# Patient Record
Sex: Female | Born: 1965 | Race: Black or African American | Hispanic: No | Marital: Married | Smoking: Never smoker
Health system: Southern US, Community
[De-identification: ages and names within clinical notes are randomized; demographics above are authoritative.]

## PROBLEM LIST (undated history)

## (undated) DIAGNOSIS — E079 Disorder of thyroid, unspecified: Secondary | ICD-10-CM

## (undated) DIAGNOSIS — I10 Essential (primary) hypertension: Secondary | ICD-10-CM

## (undated) HISTORY — PX: ABDOMINAL HYSTERECTOMY: SHX81

---

## 2011-01-08 ENCOUNTER — Ambulatory Visit: Payer: Self-pay | Admitting: Family Medicine

## 2011-04-12 ENCOUNTER — Ambulatory Visit: Payer: Self-pay | Admitting: Unknown Physician Specialty

## 2011-04-16 LAB — PATHOLOGY REPORT

## 2012-01-09 ENCOUNTER — Ambulatory Visit: Payer: Self-pay | Admitting: Family Medicine

## 2014-10-04 ENCOUNTER — Other Ambulatory Visit: Payer: Self-pay | Admitting: Family Medicine

## 2014-10-04 DIAGNOSIS — Z1231 Encounter for screening mammogram for malignant neoplasm of breast: Secondary | ICD-10-CM

## 2014-10-25 ENCOUNTER — Ambulatory Visit: Admission: RE | Admit: 2014-10-25 | Payer: Self-pay | Source: Ambulatory Visit

## 2015-02-04 ENCOUNTER — Emergency Department
Admission: EM | Admit: 2015-02-04 | Discharge: 2015-02-04 | Disposition: A | Discharge status: Home / Self Care | Attending: Emergency Medicine | Admitting: Emergency Medicine

## 2015-02-04 ENCOUNTER — Encounter: Payer: Self-pay | Admitting: Emergency Medicine

## 2015-02-04 DIAGNOSIS — Z792 Long term (current) use of antibiotics: Secondary | ICD-10-CM | POA: Insufficient documentation

## 2015-02-04 DIAGNOSIS — Z8639 Personal history of other endocrine, nutritional and metabolic disease: Secondary | ICD-10-CM | POA: Diagnosis not present

## 2015-02-04 DIAGNOSIS — Z79899 Other long term (current) drug therapy: Secondary | ICD-10-CM | POA: Diagnosis not present

## 2015-02-04 DIAGNOSIS — I10 Essential (primary) hypertension: Secondary | ICD-10-CM | POA: Diagnosis not present

## 2015-02-04 DIAGNOSIS — E0500 Thyrotoxicosis with diffuse goiter without thyrotoxic crisis or storm: Secondary | ICD-10-CM | POA: Diagnosis not present

## 2015-02-04 DIAGNOSIS — H16201 Unspecified keratoconjunctivitis, right eye: Secondary | ICD-10-CM | POA: Insufficient documentation

## 2015-02-04 DIAGNOSIS — H5711 Ocular pain, right eye: Secondary | ICD-10-CM | POA: Diagnosis present

## 2015-02-04 DIAGNOSIS — H10421 Simple chronic conjunctivitis, right eye: Secondary | ICD-10-CM

## 2015-02-04 HISTORY — DX: Disorder of thyroid, unspecified: E07.9

## 2015-02-04 HISTORY — DX: Essential (primary) hypertension: I10

## 2015-02-04 MED ORDER — FLUORESCEIN SODIUM 1 MG OP STRP
ORAL_STRIP | OPHTHALMIC | Status: AC
  Administered 2015-02-04: 11:00:00
  Filled 2015-02-04: qty 1

## 2015-02-04 NOTE — ED Notes (Signed)
Reports right eye redness and swelling to lid

## 2015-02-04 NOTE — Discharge Instructions (Signed)
Exophthalmos  Exophthalmos (also called proptosis) is a condition where the eyes move forward. They look as if they are "popping out." This can happen in one or both eyes. When the eyes are pushed forward, damage can be done to:  The main nerve between the eye and the brain that contains the nerves for vision (optic nerve).  The muscles that make the eye move.  The inside of the eye from increased pressure (glaucoma).  The front surface of the eye (cornea) because of exposure and dryness. CAUSES   Thyroid disease.  Overactive thyroid gland.  Certain diseases where the body reacts to its own tissues (autoimmune diseases).  Graves disease (a form of overactive thyroid disease).  Wegener's disease.  Others.  Anything pushing the eyes forward from behind.  Tumor(s).  Eye cancer.  Bleeding behind the eye from a tumor or blood vessel problems.  Trauma (with bleeding behind the eye).  Problems with the arteries and veins behind the eye (aneurysm, cavernous sinus thrombosis, etc).  Cysts or a pus filled area (abscess) behind the eye.  Tumors that have spread to the eye socket from cancer in other areas of the body (metastatic cancer).  Cancer of the blood system (lymphoma and others).  Infection behind the eye.  An abnormal skull structure.  Some genetic diseases and abnormalities.  Pseudoproptosis (or false proptosis). This is a condition where the eye looks like it is pushed forward but is really not. The eye is just bigger (longer) than normal, or the opposite eye is smaller than normal, which makes one eye look bigger.  Prominent eyes in otherwise normal people. SYMPTOMS   Bulging eye(s).  Dry and irritated eyes.  Eyes not closing all the way when asleep.  Double vision - seeing two of everything (diplopia).  Trouble looking up with one or both eyes.  Symptoms of the disease causing exophthalmos. For instance, with an overactive thyroid gland, you may feel  hot all of the time, sweat a lot, have weight loss and a lot of energy. DIAGNOSIS  An eye professional can tell you if you have this condition during an eye exam. He or she can measure how far the eye(s) are forward compared to normal. X-rays, CT scan, ultrasound and other tests may be done to look at the area behind the eyes. TREATMENT   Treatment is aimed at the condition causing exophthalmos.  If mild double vision is present, it may be possible to relieve the symptoms with special glasses containing prisms.  If severe double vision is present, or if there is danger to the eyes, surgery may be needed. Surgery can relieve the pressure on the eyes. It can also free up the eye muscles so the eyes can move properly. SEEK MEDICAL CARE IF:   It looks like one or both eyes bulging forward.  You have double vision.  You have trouble looking up.  You generally do not feel well. Document Released: 04/20/2004 Document Revised: 08/12/2011 Document Reviewed: 09/16/2007 [facility] Patient Information 2015 [place], [place]. This information is not intended to replace advice given to you by your health care provider. Make sure you discuss any questions you have with your health care provider.  Your exam did not reveal an injury to your eye or cornea. You should restart your eye drops as soon as possible. Apply cool compresses to reduce eyelid swelling, and wear sunglasses to reduce sun sensitivity. Follow-up with your ophthalmologists as soon as possible for further care. Return as needed for  worsening symptoms.

## 2015-02-04 NOTE — ED Provider Notes (Signed)
[facility] Emergency Department Provider Note ____________________________________________  Time seen: 1007  I have reviewed the triage vital signs and the nursing notes.  HISTORY  Chief Complaint  Eye Pain  HPI Jeanette Leon is a 49 y.o. female reports to the ED for evaluation and management of her right eye light sensitivity for the last day. She also reports that her right eye was increasingly swollen and red. She was seen by her ophthalmologist about 2 weeks prior, and prescribed Restasis and a Pred Forte drops. She does admit tonot dosing as to medications over the last 2 days. Which she describes a sudden onset of increasing irritation, light sensitivity to the right eye without outright pain. She describes onset was last night while she was asleep. She claims to have the sensation that her right eye had "popped out," due to her thyroid disease. She had the sensation of fullness and swelling above baseline to the right eye. He denies any eye trauma or eye injury, foreign body sensation, or visual changes. She does note some increased tearing to the eye.  Past Medical History  Diagnosis Date  . Thyroid disease   . Hypertension    There are no active problems to display for this patient.  History reviewed. No pertinent past surgical history.  Current Outpatient Rx  Name  Route  Sig  Dispense  Refill  . atorvastatin (LIPITOR) 20 MG tablet   Oral   Take 20 mg by mouth daily.         . cycloSPORINE (RESTASIS) 0.05 % ophthalmic emulsion   Both Eyes   Place 1 drop into both eyes 2 (two) times daily.         . hydrochlorothiazide (HYDRODIURIL) 25 MG tablet   Oral   Take 25 mg by mouth daily.         Marland Kitchen levothyroxine (SYNTHROID, LEVOTHROID) 112 MCG tablet   Oral   Take 112 mcg by mouth daily before breakfast.         . liothyronine (CYTOMEL) 5 MCG tablet   Oral   Take 5 mcg by mouth daily.         . prednisoLONE acetate (PRED FORTE) 1 %  ophthalmic suspension   Both Eyes   Place 1 drop into both eyes 4 (four) times daily.          Allergies Review of patient's allergies indicates no known allergies.  History reviewed. No pertinent family history.  Social History Social History  Substance Use Topics  . Smoking status: Never Smoker   . Smokeless tobacco: None  . Alcohol Use: Yes   Review of Systems  Constitutional: Negative for fever. Eyes: Negative for visual changes. Right eye redness and light sensitivity as above. ENT: Negative for sore throat. Cardiovascular: Negative for chest pain. Respiratory: Negative for shortness of breath. Gastrointestinal: Negative for abdominal pain, vomiting and diarrhea. Genitourinary: Negative for dysuria. Musculoskeletal: Negative for back pain. Skin: Negative for rash. Neurological: Negative for headaches, focal weakness or numbness. ____________________________________________  PHYSICAL EXAM:  VITAL SIGNS: ED Triage Vitals  Enc Vitals Group     BP 02/04/15 0945 124/75 mmHg     Pulse Rate 02/04/15 0945 69     Resp 02/04/15 0945 18     Temp 02/04/15 0945 98.3 F (36.8 C)     Temp Source 02/04/15 0945 Oral     SpO2 02/04/15 0945 99 %     Weight 02/04/15 0945 170 lb (77.111 kg)  Height 02/04/15 0945 5\' 6"  (1.676 m)     Head Cir --      Peak Flow --      Pain Score 02/04/15 0952 1     Pain Loc --      Pain Edu? --      Excl. in GC? --    Constitutional: Alert and oriented. Well appearing and in no distress. Eyes: Right eye with mild exophthalmos compared to the left. Mild local periorbital edema noted.  Conjunctivae are injected bilaterally. Mild conjunctival edema on the right. PERRL. Normal extraocular movements. No fluorescein dye uptake on exam.  ENT   Head: Normocephalic and atraumatic.   Nose: No congestion/rhinnorhea.   Mouth/Throat: Mucous membranes are moist.   Neck: Supple. No thyromegaly. Hematological/Lymphatic/Immunilogical: No  cervical lymphadenopathy. Cardiovascular: Normal rate, regular rhythm.  Respiratory: Normal respiratory effort. No wheezes/rales/rhonchi. Gastrointestinal: Soft and nontender. No distention. Musculoskeletal: Nontender with normal range of motion in all extremities.  Neurologic:  Normal gait without ataxia. Normal speech and language. No gross focal neurologic deficits are appreciated. Skin:  Skin is warm, dry and intact. No rash noted. Psychiatric: Mood and affect are normal. Patient exhibits appropriate insight and judgment. ____________________________________________  INITIAL IMPRESSION / ASSESSMENT AND PLAN / ED COURSE  Reassurance to the patient about her benign exam. Symptoms are likely due to her chronic proptosis that may have been aggravated by her lack of use of her rewetting drops. No indication of corneal injury or ulcer on exam. Suggest patient restart her eyedrops as previously prescribed. Follow-up with Dr. Dion Body or return to the ED for worsening symptoms. 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

## 2016-04-11 ENCOUNTER — Other Ambulatory Visit: Payer: Self-pay | Admitting: Family Medicine

## 2016-04-11 DIAGNOSIS — Z1239 Encounter for other screening for malignant neoplasm of breast: Secondary | ICD-10-CM

## 2016-05-29 ENCOUNTER — Ambulatory Visit
Admission: RE | Admit: 2016-05-29 | Discharge: 2016-05-29 | Disposition: A | Discharge status: Home / Self Care | Source: Ambulatory Visit | Attending: Family Medicine | Admitting: Family Medicine

## 2016-05-29 DIAGNOSIS — Z1231 Encounter for screening mammogram for malignant neoplasm of breast: Secondary | ICD-10-CM | POA: Diagnosis not present

## 2016-05-29 DIAGNOSIS — Z1239 Encounter for other screening for malignant neoplasm of breast: Secondary | ICD-10-CM

## 2016-11-04 ENCOUNTER — Emergency Department
Admission: EM | Admit: 2016-11-04 | Discharge: 2016-11-04 | Disposition: A | Discharge status: Home / Self Care | Attending: Emergency Medicine | Admitting: Emergency Medicine

## 2016-11-04 ENCOUNTER — Encounter: Payer: Self-pay | Admitting: Emergency Medicine

## 2016-11-04 DIAGNOSIS — M5117 Intervertebral disc disorders with radiculopathy, lumbosacral region: Secondary | ICD-10-CM | POA: Insufficient documentation

## 2016-11-04 DIAGNOSIS — I10 Essential (primary) hypertension: Secondary | ICD-10-CM | POA: Insufficient documentation

## 2016-11-04 DIAGNOSIS — M545 Low back pain: Secondary | ICD-10-CM | POA: Diagnosis present

## 2016-11-04 DIAGNOSIS — Z79899 Other long term (current) drug therapy: Secondary | ICD-10-CM | POA: Insufficient documentation

## 2016-11-04 DIAGNOSIS — M5431 Sciatica, right side: Secondary | ICD-10-CM

## 2016-11-04 DIAGNOSIS — M5137 Other intervertebral disc degeneration, lumbosacral region: Secondary | ICD-10-CM

## 2016-11-04 MED ORDER — KETOROLAC TROMETHAMINE 30 MG/ML IJ SOLN
60.0000 mg | Freq: Once | INTRAMUSCULAR | Status: AC
  Administered 2016-11-04: 30 mg via INTRAMUSCULAR
  Filled 2016-11-04: qty 2

## 2016-11-04 MED ORDER — ORPHENADRINE CITRATE 30 MG/ML IJ SOLN
60.0000 mg | INTRAMUSCULAR | Status: AC
  Administered 2016-11-04: 60 mg via INTRAMUSCULAR
  Filled 2016-11-04: qty 2

## 2016-11-04 MED ORDER — DIAZEPAM 2 MG PO TABS: 2.0000 mg | ORAL_TABLET | Freq: Three times a day (TID) | ORAL | 0 refills | Status: AC | PRN

## 2016-11-04 MED ORDER — PREDNISONE 10 MG PO TABS: 10.0000 mg | ORAL_TABLET | Freq: Two times a day (BID) | ORAL | 0 refills | Status: DC

## 2016-11-04 NOTE — Discharge Instructions (Signed)
You are being treated for a sciatic nerve irritation. Take the prescription meds as directed. Apply ice or moist heat to the area for comfort. Follow-up with Dr. Terance HartBronstein for ongoing management. Monitor your body mechanics and return to the ED for worsening pain, leg weakness, or loss of bladder or bowel control.

## 2016-11-04 NOTE — ED Notes (Signed)
See triage note  Developed right lower back pain which moves into right leg about 2 weeks ago..was seen by PCP and placed on mobic and muscle relaxers  States pain is not any better.   Pain is worse with sitting or standing

## 2016-11-04 NOTE — ED Triage Notes (Signed)
Pt c/o lower back pain X 2 weeks that is radiating down right leg. [facility]  Pt appears in pain. Worse with movement. No loss bowel or bladder.

## 2016-11-05 NOTE — ED Provider Notes (Signed)
[facility] Emergency Department Provider Note ____________________________________________  Time seen: 1032  I have reviewed the triage vital signs and the nursing notes.  HISTORY  Chief Complaint  Back Pain  HPI Jeanette Leon is a 51 y.o. female [facility] for evaluation of continued low back pain. Patient describes onset of low back pain about 2 weeks prior. She reports radiation of pain in the posterior right leg to the ankle. She was evaluated by her primary care provider, Dr. Terance HartBronstein, who scheduled her for a x-ray. She was discharged with prescriptions for Flexeril and Mobic. She returns today after her pain worsened. She attempted to follow with Dr. Terance HartBronstein in the office, but he was unavailable. [facility] and was referred to the ED for further management. She was able to vision on her My Chart, to confirm a diagnosis of DDD of the lumbar spine.  Past Medical History:  Diagnosis Date  . Hypertension   . Thyroid disease     There are no active problems to display for this patient.   History reviewed. No pertinent surgical history.  Prior to Admission medications   Medication Sig Start Date End Date Taking? Authorizing Provider  cyclobenzaprine (FLEXERIL) 10 MG tablet Take 10 mg by mouth 3 (three) times daily as needed for muscle spasms.   Yes [provider]  meloxicam (MOBIC) 15 MG tablet Take 15 mg by mouth daily.   Yes [provider]  atorvastatin (LIPITOR) 20 MG tablet Take 20 mg by mouth daily.    [provider]  cycloSPORINE (RESTASIS) 0.05 % ophthalmic emulsion Place 1 drop into both eyes 2 (two) times daily.    [provider]  diazepam (VALIUM) 2 MG tablet Take 1 tablet (2 mg total) by mouth every 8 (eight) hours as needed for muscle spasms. 11/04/16   Milarose Savich, Charlesetta IvoryJenise V Bacon, PA-C  hydrochlorothiazide (HYDRODIURIL) 25 MG tablet Take 25 mg by mouth daily.     [provider]  levothyroxine (SYNTHROID, LEVOTHROID) 112 MCG tablet Take 112 mcg by mouth daily before breakfast.    [provider]  liothyronine (CYTOMEL) 5 MCG tablet Take 5 mcg by mouth daily.    [provider]  prednisoLONE acetate (PRED FORTE) 1 % ophthalmic suspension Place 1 drop into both eyes 4 (four) times daily.    [provider]  predniSONE (DELTASONE) 10 MG tablet Take 1 tablet (10 mg total) by mouth 2 (two) times daily with a meal. 11/04/16   Kyjuan Gause, Charlesetta IvoryJenise V Bacon, PA-C    Allergies Patient has no known allergies.  Family History  Problem Relation Age of Onset  . Breast cancer Maternal Aunt        60's  . Breast cancer Paternal Aunt 1055    Social History Social History  Substance Use Topics  . Smoking status: Never Smoker  . Smokeless tobacco: Not on file  . Alcohol use Yes    Review of Systems  Constitutional: Negative for fever. Cardiovascular: Negative for chest pain. Respiratory: Negative for shortness of breath. Gastrointestinal: Negative for abdominal pain, vomiting and diarrhea. Genitourinary: Negative for dysuria. Musculoskeletal: Positive for back pain. Skin: Negative for rash. Neurological: Negative for headaches, focal weakness or numbness. ____________________________________________  PHYSICAL EXAM:  VITAL SIGNS: ED Triage Vitals  Enc Vitals Group     BP 11/04/16 0951 (!) 139/110     Pulse Rate 11/04/16 0951 70     Resp 11/04/16 0951 20  Temp 11/04/16 0951 97.5 F (36.4 C)     Temp Source 11/04/16 0951 Oral     SpO2 11/04/16 0951 100 %     Weight --      Height --      Head Circumference --      Peak Flow --      Pain Score 11/04/16 0948 10     Pain Loc --      Pain Edu? --      Excl. in GC? --     Constitutional: Alert and oriented. Well appearing and in no distress. Head: Normocephalic and atraumatic. Cardiovascular: Normal rate, regular rhythm. Normal distal pulses. Respiratory:  Normal respiratory effort. No wheezes/rales/rhonchi. Gastrointestinal: Soft and nontender. No distention. Musculoskeletal: Normal spinal alignment without midline tenderness, spasm, deformity, or step-off. Patient is to palpation over the right gluteal region with referral down the posterior right lower extremity. She is to demonstrate normal toe and heel walking. Decreased lumbar extension and flexion range secondary to pain. Nontender with normal range of motion in all extremities.  Neurologic: Cranial nerves II through XII grossly intact. Normal LE DTRs bilaterally. Antalgic cane-assisted gait without ataxia. Normal speech and language. No gross focal neurologic deficits are appreciated. Skin:  Skin is warm, dry and intact. No rash noted. Psychiatric: Mood and affect are normal. Patient exhibits appropriate insight and judgment. ___________________________________________  PROCEDURES  Toradol 30 mg IM Norflex 60 mg IM ____________________________________________  INITIAL IMPRESSION / ASSESSMENT AND PLAN / ED COURSE  Patient with a presentation consistent with an acute right sciatica and underlying DDD. I am Unable to the patient's recent x-rays or report as they have not been confirmed in the system. She has her clinical presentation however which does appear to be consistent with her my chart information. She'll be discharged with prescriptions for prednisone and Valium 2 doses directed. She will follow up Dr. Terance Hart for further evaluation and management of her symptoms. Return precautions are reviewed. ____________________________________________  FINAL CLINICAL IMPRESSION(S) / ED DIAGNOSES  Final diagnoses:  DDD (degenerative disc disease), lumbosacral  Sciatica of right side      Karmen Stabs, Charlesetta Ivory, PA-C 11/05/16 2344    Myrna Blazer, MD 11/08/16 1331

## 2017-09-17 ENCOUNTER — Other Ambulatory Visit: Payer: Self-pay | Admitting: Family Medicine

## 2017-09-17 DIAGNOSIS — Z1231 Encounter for screening mammogram for malignant neoplasm of breast: Secondary | ICD-10-CM

## 2017-09-24 ENCOUNTER — Ambulatory Visit
Admission: RE | Admit: 2017-09-24 | Discharge: 2017-09-24 | Disposition: A | Discharge status: Home / Self Care | Source: Ambulatory Visit | Attending: Family Medicine | Admitting: Family Medicine

## 2017-09-24 ENCOUNTER — Encounter (INDEPENDENT_AMBULATORY_CARE_PROVIDER_SITE_OTHER): Payer: Self-pay

## 2017-09-24 DIAGNOSIS — Z1231 Encounter for screening mammogram for malignant neoplasm of breast: Secondary | ICD-10-CM | POA: Diagnosis not present

## 2018-10-30 ENCOUNTER — Ambulatory Visit: Admit: 2018-10-30 | Admitting: Unknown Physician Specialty

## 2018-10-30 SURGERY — COLONOSCOPY WITH PROPOFOL
Anesthesia: General

## 2019-03-23 ENCOUNTER — Other Ambulatory Visit: Payer: Self-pay | Admitting: Family Medicine

## 2019-03-23 DIAGNOSIS — Z1231 Encounter for screening mammogram for malignant neoplasm of breast: Secondary | ICD-10-CM

## 2019-06-10 ENCOUNTER — Encounter

## 2019-07-31 ENCOUNTER — Ambulatory Visit: Attending: Internal Medicine

## 2019-07-31 DIAGNOSIS — Z23 Encounter for immunization: Secondary | ICD-10-CM | POA: Insufficient documentation

## 2019-07-31 NOTE — Progress Notes (Signed)
   Covid-19 Vaccination Clinic  Name:  Jeanette Leon    MRN: [id number] DOB: Nov 15, 1965  07/31/2019  Jeanette Leon was observed post Covid-19 immunization for 15 minutes without incidence. She was provided with Vaccine Information Sheet and instruction to access the V-Safe system.   Jeanette Leon was instructed to call 911 with any severe reactions post vaccine: Marland Kitchen Difficulty breathing  . Swelling of your face and throat  . A fast heartbeat  . A bad rash all over your body  . Dizziness and weakness    Immunizations Administered    Name Date Dose VIS Date Route   Pfizer COVID-19 Vaccine 07/31/2019  9:29 AM 0.3 mL 05/14/2019 Intramuscular   Manufacturer: [employer], [employer]   Lot: [id number]   NDC: [address]-4

## 2019-08-01 ENCOUNTER — Ambulatory Visit

## 2019-08-21 ENCOUNTER — Ambulatory Visit: Attending: Internal Medicine

## 2019-08-21 DIAGNOSIS — Z23 Encounter for immunization: Secondary | ICD-10-CM

## 2019-08-21 NOTE — Progress Notes (Signed)
   Covid-19 Vaccination Clinic  Name:  Nan Maya    MRN: [id number] DOB: Sep 05, 1965  08/21/2019  Ms. Obeid was observed post Covid-19 immunization for 15 minutes without incident. She was provided with Vaccine Information Sheet and instruction to access the V-Safe system.   Ms. Lucente was instructed to call 911 with any severe reactions post vaccine: Marland Kitchen Difficulty breathing  . Swelling of face and throat  . A fast heartbeat  . A bad rash all over body  . Dizziness and weakness   Immunizations Administered    Name Date Dose VIS Date Route   Pfizer COVID-19 Vaccine 08/21/2019 12:45 PM 0.3 mL 05/14/2019 Intramuscular   Manufacturer: [employer], [employer]   Lot: [id number]   NDC: [address]-9

## 2019-08-25 ENCOUNTER — Ambulatory Visit

## 2019-11-16 ENCOUNTER — Ambulatory Visit
Admission: RE | Admit: 2019-11-16 | Discharge: 2019-11-16 | Disposition: A | Discharge status: Home / Self Care | Source: Ambulatory Visit | Attending: Family Medicine | Admitting: Family Medicine

## 2019-11-16 DIAGNOSIS — Z1231 Encounter for screening mammogram for malignant neoplasm of breast: Secondary | ICD-10-CM | POA: Insufficient documentation

## 2021-12-09 IMAGING — MG DIGITAL SCREENING BILAT W/ TOMO W/ CAD
6 of 10 series · 6 of 30 positions shown · non-contrast
Comparison: Previous exam(s).

CLINICAL DATA: Screening.

EXAM:
DIGITAL SCREENING BILATERAL MAMMOGRAM WITH TOMO AND CAD

[L CC synth-2D]
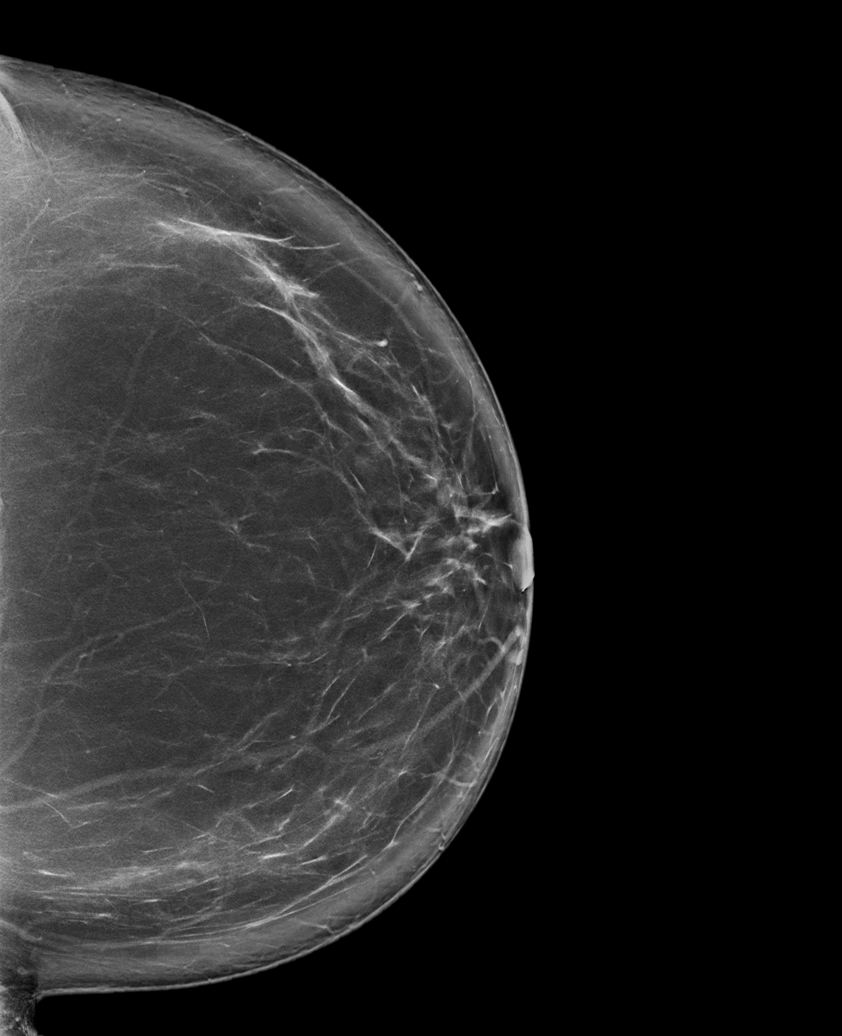

[R MLO synth-2D (1 of 2)]
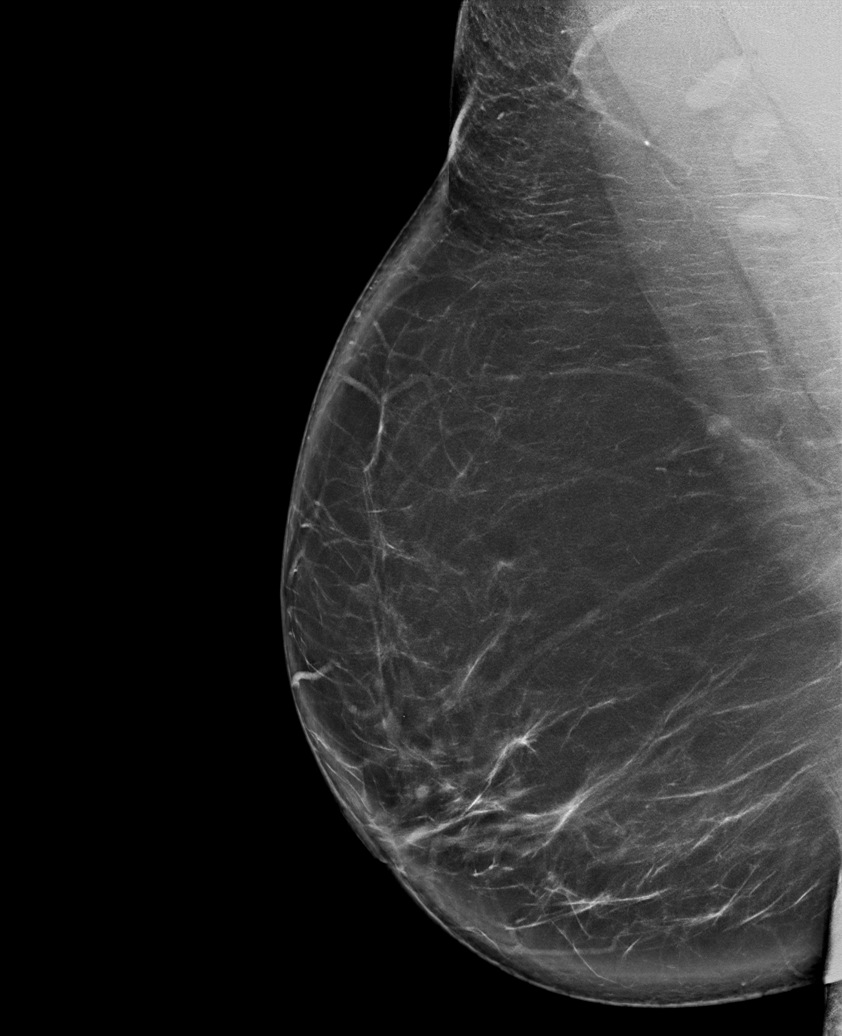

[R MLO synth-2D (2 of 2)]
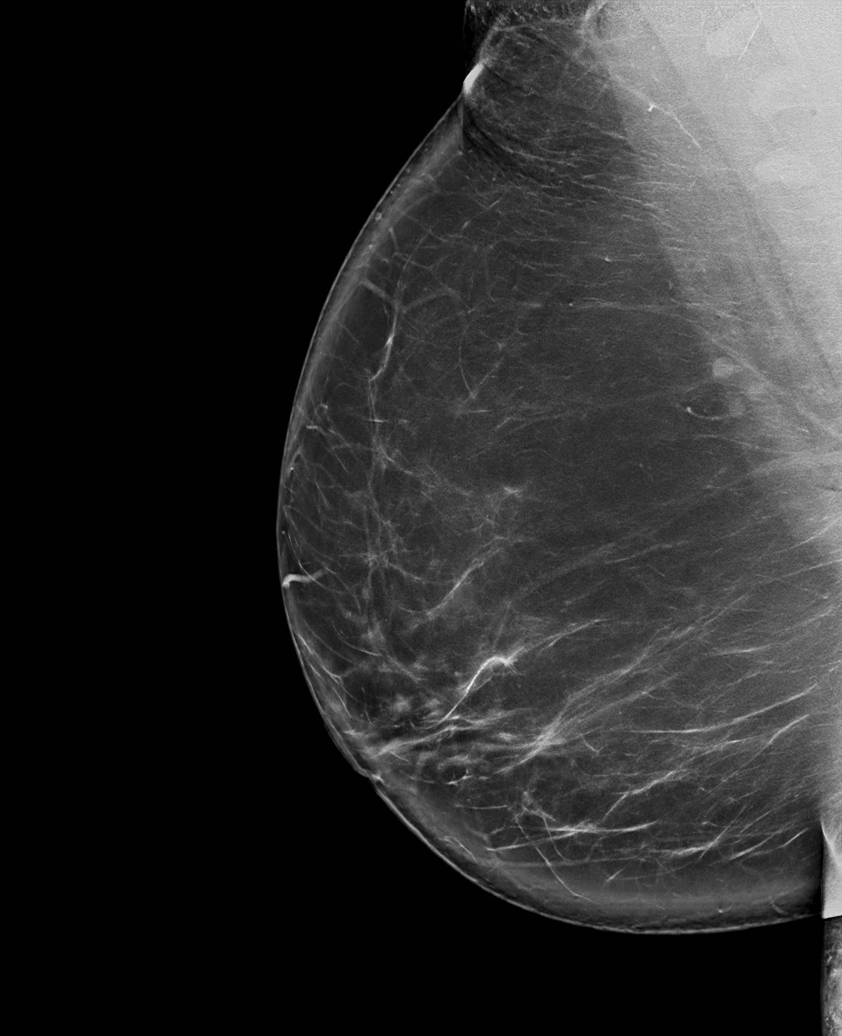

[L MLO synth-2D]
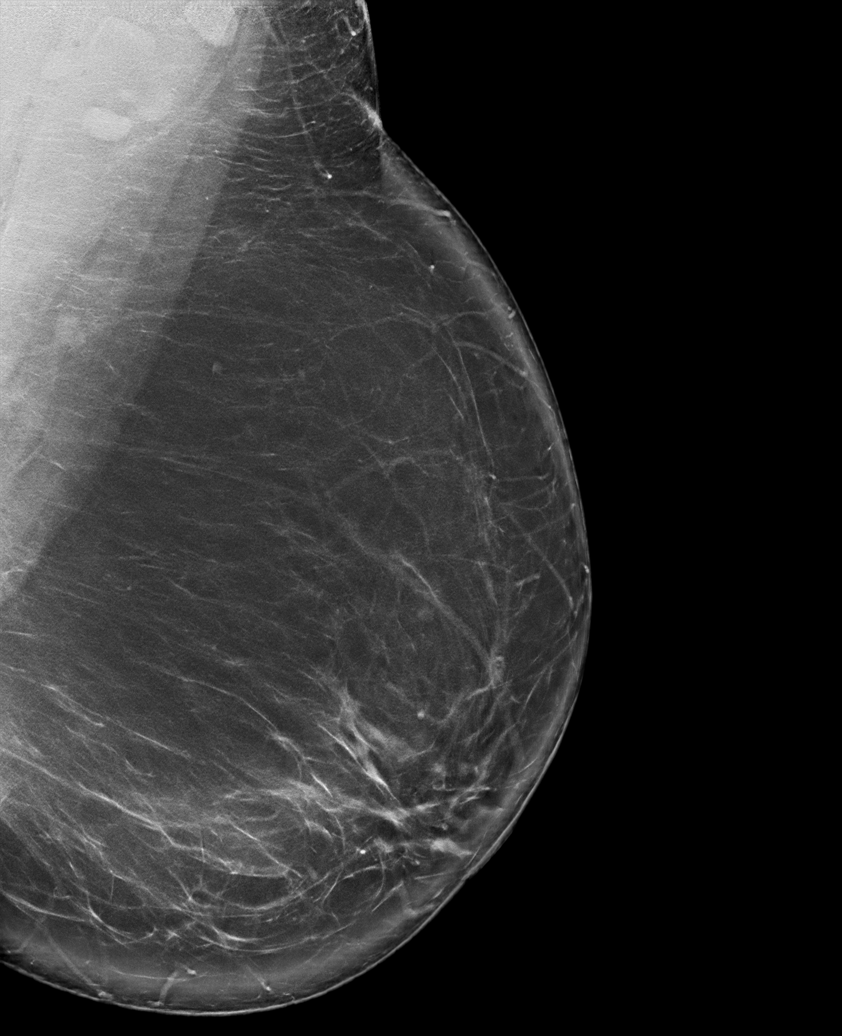

[R CC synth-2D]
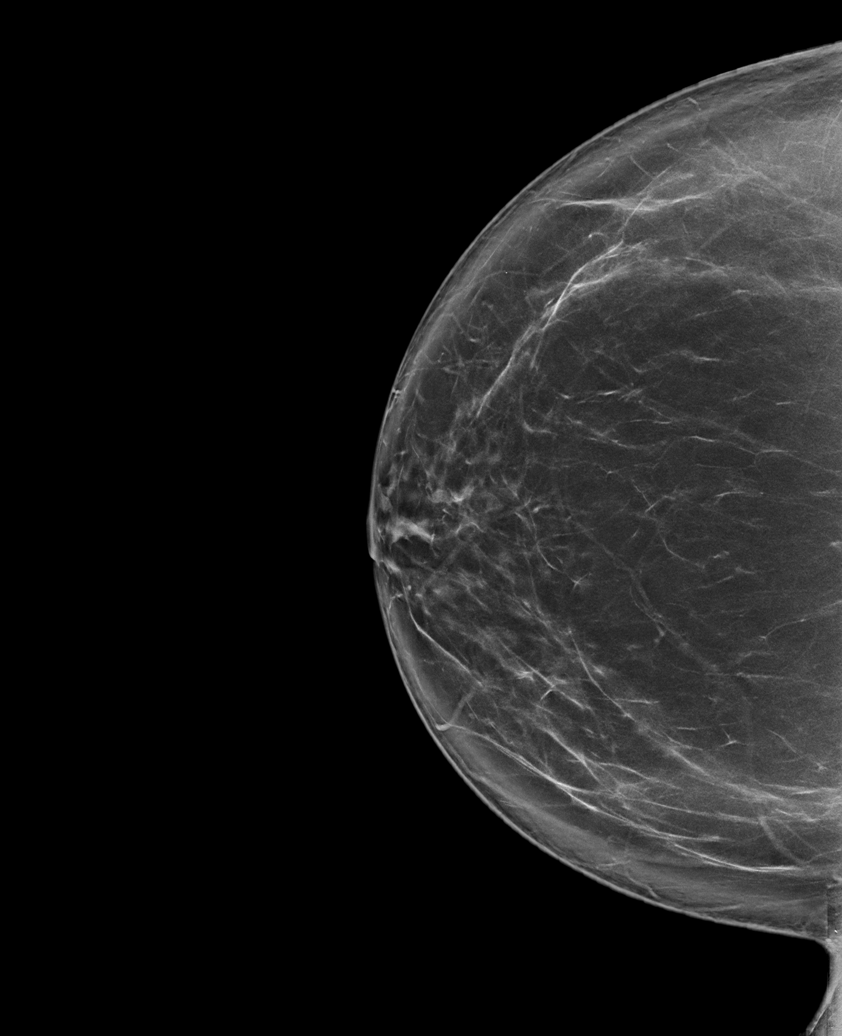

[R MLO tomo · tomo slice 61/121.0]
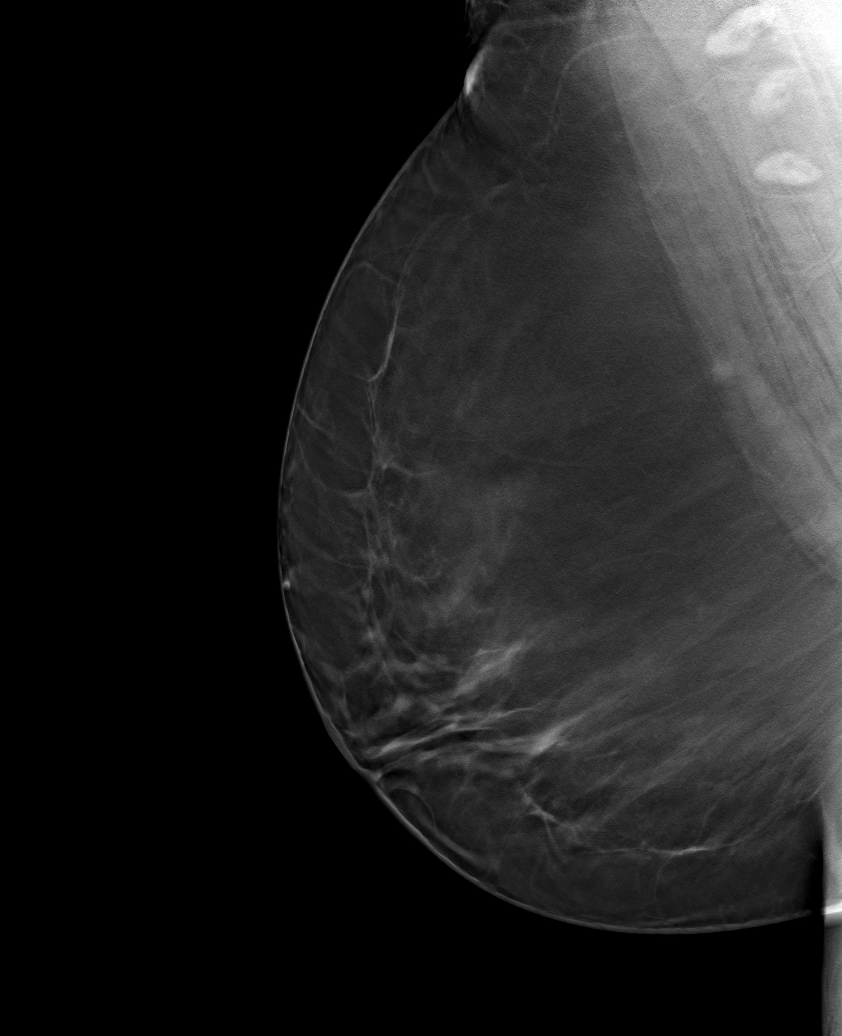

[6 of 30 positions shown; findings below may reference images not displayed]

ACR Breast Density Category b: There are scattered areas of
fibroglandular density.
FINDINGS: There are no findings suspicious for malignancy. Images were
processed with CAD.
IMPRESSION: No mammographic evidence of malignancy. A result letter of this
screening mammogram will be mailed directly to the patient.

RECOMMENDATION:
Screening mammogram in one year. (Code:[id number])

BI-RADS CATEGORY  1: Negative.

## 2022-06-30 ENCOUNTER — Other Ambulatory Visit: Payer: Self-pay

## 2022-06-30 ENCOUNTER — Emergency Department
Admission: EM | Admit: 2022-06-30 | Discharge: 2022-06-30 | Disposition: A | Discharge status: Home / Self Care | Attending: Emergency Medicine | Admitting: Emergency Medicine

## 2022-06-30 ENCOUNTER — Emergency Department

## 2022-06-30 DIAGNOSIS — I10 Essential (primary) hypertension: Secondary | ICD-10-CM | POA: Insufficient documentation

## 2022-06-30 DIAGNOSIS — E039 Hypothyroidism, unspecified: Secondary | ICD-10-CM | POA: Diagnosis not present

## 2022-06-30 LAB — BASIC METABOLIC PANEL
Anion gap: 10 (ref 5–15)
BUN: 17 mg/dL (ref 6–20)
CO2: 29 mmol/L (ref 22–32)
Calcium: 9.8 mg/dL (ref 8.9–10.3)
Chloride: 106 mmol/L (ref 98–111)
Creatinine, Ser: 1.28 mg/dL — ABNORMAL HIGH (ref 0.44–1.00)
GFR, Estimated: 50 mL/min — ABNORMAL LOW (ref >60)
Glucose, Bld: 132 mg/dL — ABNORMAL HIGH (ref 70–99)
Potassium: 3 mmol/L — ABNORMAL LOW (ref 3.5–5.1)
Sodium: 145 mmol/L (ref 135–145)

## 2022-06-30 LAB — URINALYSIS, ROUTINE W REFLEX MICROSCOPIC
Bacteria, UA: NONE SEEN
Bilirubin Urine: NEGATIVE
Glucose, UA: 50 mg/dL — AB
Hgb urine dipstick: NEGATIVE
Ketones, ur: NEGATIVE mg/dL
Nitrite: NEGATIVE
Protein, ur: NEGATIVE mg/dL
Specific Gravity, Urine: 1.02 (ref 1.005–1.030)
pH: 6 (ref 5.0–8.0)

## 2022-06-30 LAB — CBC
HCT: 45.2 % (ref 36.0–46.0)
Hemoglobin: 15.5 g/dL — ABNORMAL HIGH (ref 12.0–15.0)
MCH: 28.9 pg (ref 26.0–34.0)
MCHC: 34.3 g/dL (ref 30.0–36.0)
MCV: 84.2 fL (ref 80.0–100.0)
Platelets: 300 10*3/uL (ref 150–400)
RBC: 5.37 MIL/uL — ABNORMAL HIGH (ref 3.87–5.11)
RDW: 12.7 % (ref 11.5–15.5)
WBC: 4.4 10*3/uL (ref 4.0–10.5)
nRBC: 0 % (ref 0.0–0.2)

## 2022-06-30 LAB — TROPONIN I (HIGH SENSITIVITY): Troponin I (High Sensitivity): 9 ng/L (ref <18)

## 2022-06-30 LAB — LIPID PANEL
Cholesterol: 205 mg/dL — ABNORMAL HIGH (ref 0–200)
HDL: 43 mg/dL (ref >40)
LDL Cholesterol: 124 mg/dL — ABNORMAL HIGH (ref 0–99)
Total CHOL/HDL Ratio: 4.8 RATIO
Triglycerides: 190 mg/dL — ABNORMAL HIGH (ref <150)
VLDL: 38 mg/dL (ref 0–40)

## 2022-06-30 LAB — HEPATIC FUNCTION PANEL
ALT: 38 U/L (ref 0–44)
AST: 32 U/L (ref 15–41)
Albumin: 4.4 g/dL (ref 3.5–5.0)
Alkaline Phosphatase: 112 U/L (ref 38–126)
Bilirubin, Direct: <0.1 mg/dL (ref 0.0–0.2)
Total Bilirubin: 0.7 mg/dL (ref 0.3–1.2)
Total Protein: 8.3 g/dL — ABNORMAL HIGH (ref 6.5–8.1)

## 2022-06-30 LAB — TSH: TSH: 1.835 u[IU]/mL (ref 0.350–4.500)

## 2022-06-30 LAB — T4, FREE: Free T4: 1.02 ng/dL (ref 0.61–1.12)

## 2022-06-30 MED ORDER — AMLODIPINE BESYLATE 5 MG PO TABS: 5.0000 mg | ORAL_TABLET | Freq: Every day | ORAL | 2 refills | Status: DC

## 2022-06-30 MED ORDER — HYDRALAZINE HCL 20 MG/ML IJ SOLN
10.0000 mg | Freq: Once | INTRAMUSCULAR | Status: AC
  Administered 2022-06-30: 10 mg via INTRAVENOUS
  Filled 2022-06-30: qty 1

## 2022-06-30 MED ORDER — AMLODIPINE BESYLATE 5 MG PO TABS
10.0000 mg | ORAL_TABLET | Freq: Once | ORAL | Status: AC
  Administered 2022-06-30: 10 mg via ORAL
  Filled 2022-06-30: qty 2

## 2022-06-30 MED ORDER — AMLODIPINE BESYLATE 5 MG PO TABS: 5.0000 mg | ORAL_TABLET | Freq: Once | ORAL | Status: DC

## 2022-06-30 MED ORDER — AMLODIPINE BESYLATE 10 MG PO TABS: 10.0000 mg | ORAL_TABLET | Freq: Every day | ORAL | 2 refills | Status: AC

## 2022-06-30 NOTE — ED Notes (Signed)
Gave report to Kelly RN

## 2022-06-30 NOTE — ED Provider Notes (Signed)
[facility] Provider Note    Event Date/Time   First MD Initiated Contact with Patient 06/30/22 1820     (approximate)   History   Chief Complaint: Hypertension   HPI  Jeanette Leon is a 57 y.o. female with a past history of hypothyroidism who comes to the ED complaining high blood pressure along with intermittent headaches and blurry vision.  No chest pain shortness of breath or neck pain.  No dizziness or syncope or paresthesias or motor weakness.  Saw PCP 2 days ago, blood pressure was 160/100, plan was to monitor at that time.  However, over the weekend blood pressures become much more elevated.  Denies any changes in diet or lifestyle.     Physical Exam   Triage Vital Signs: ED Triage Vitals  Enc Vitals Group     BP 06/30/22 1811 (!) 244/134     Pulse Rate 06/30/22 1811 83     Resp 06/30/22 1811 16     Temp 06/30/22 1811 98.6 F (37 C)     Temp src --      SpO2 06/30/22 1811 98 %     Weight 06/30/22 1807 169 lb 12.1 oz (77 kg)     Height 06/30/22 1807 5\' 6"  (1.676 m)     Head Circumference --      Peak Flow --      Pain Score 06/30/22 1807 0     Pain Loc --      Pain Edu? --      Excl. in [place]? --     Most recent vital signs: Vitals:   06/30/22 1920 06/30/22 1942  BP: (!) 206/95 (!) 117/101  Pulse: 88 83  Resp: 20 18  Temp:    SpO2: 99% 99%    General: Awake, no distress.  CV:  Good peripheral perfusion.  Regular rate rhythm.  Systolic murmur. Resp:  Normal effort.  Clear to auscultation bilaterally Abd:  No distention.  Soft nontender Other:  No lower extremity edema.  Cranial nerves II through XII intact.  No drift, normal cerebellar function   ED Results / Procedures / Treatments   Labs (all labs ordered are listed, but only abnormal results are displayed) Labs Reviewed  BASIC METABOLIC PANEL - Abnormal; Notable for the following components:      Result Value   Potassium 3.0 (*)    Creatinine, Ser 1.28 (*)    GFR,  Estimated 50 (*)    All other components within normal limits  CBC - Abnormal; Notable for the following components:   RBC 5.37 (*)    Hemoglobin 15.5 (*)    All other components within normal limits  LIPID PANEL - Abnormal; Notable for the following components:   Cholesterol 205 (*)    Triglycerides 190 (*)    LDL Cholesterol 124 (*)    All other components within normal limits  HEPATIC FUNCTION PANEL - Abnormal; Notable for the following components:   Total Protein 8.3 (*)    All other components within normal limits  T4, FREE  TSH  URINALYSIS, ROUTINE W REFLEX MICROSCOPIC  POC URINE PREG, ED  TROPONIN I (HIGH SENSITIVITY)     EKG Interpreted by me Normal sinus rhythm rate of 80.  Normal axis and intervals.  Voltage criteria for LVH in the high lateral leads.  Normal ST segments and T waves, no ischemic changes.   RADIOLOGY CT head interpreted by me, negative for mass or intracranial hemorrhage.  Radiology  report reviewed.   PROCEDURES:  Procedures   MEDICATIONS ORDERED IN ED: Medications  amLODipine (NORVASC) tablet 5 mg (has no administration in time range)  hydrALAZINE (APRESOLINE) injection 10 mg (10 mg Intravenous Given 06/30/22 1848)     IMPRESSION / MDM / ASSESSMENT AND PLAN / ED COURSE  I reviewed the triage vital signs and the nursing notes.  DDx: Cerebral hemorrhage, renal failure, electrolyte abnormality, uncontrolled hypertension, hyperthyroidism  Patient's presentation is most consistent with acute presentation with potential threat to life or bodily function.  Patient presents with nonfocal neurologic symptoms in the setting of uncontrolled hypertension.  Will obtain CT head, check labs.  Patient is scheduled for outpatient labs tomorrow, will include those today.  Will give a dose of IV hydralazine for initial blood pressure control and then plan to start amlodipine pending primary care follow-up.  ----------------------------------------- 8:06 PM  on 06/30/2022 ----------------------------------------- Blood pressure now close to normal after IV hydralazine.  CT head unremarkable.  Lab panel all reassuring, no worrisome findings.  Will give a dose of oral Norvasc, provide prescription, follow-up with primary care within a week.      FINAL CLINICAL IMPRESSION(S) / ED DIAGNOSES   Final diagnoses:  Uncontrolled hypertension     Rx / DC Orders   ED Discharge Orders          Ordered    amLODipine (NORVASC) 5 MG tablet  Daily        06/30/22 2005             Note:  This document was prepared using Dragon voice recognition software and may include unintentional dictation errors.   Carrie Mew, MD 06/30/22 2008

## 2022-06-30 NOTE — ED Triage Notes (Signed)
Pt to ED for HTN. Pt went Friday to her PCP for first apt for diagnosis. He has been having HTN for months with headaches and blurred vision. PCP did not place her on any medications. Told her to monitor over the weekend and if it did not come down to normal to come to the ER. Pt is CAOx4 and in no acute distress and ambulatory in triage/

## 2022-07-10 ENCOUNTER — Emergency Department
Admission: EM | Admit: 2022-07-10 | Discharge: 2022-07-10 | Disposition: A | Discharge status: Home / Self Care | Attending: Emergency Medicine | Admitting: Emergency Medicine

## 2022-07-10 ENCOUNTER — Other Ambulatory Visit: Payer: Self-pay

## 2022-07-10 DIAGNOSIS — I10 Essential (primary) hypertension: Secondary | ICD-10-CM | POA: Insufficient documentation

## 2022-07-10 DIAGNOSIS — R42 Dizziness and giddiness: Secondary | ICD-10-CM | POA: Diagnosis present

## 2022-07-10 MED ORDER — LISINOPRIL 10 MG PO TABS: 10.0000 mg | ORAL_TABLET | Freq: Every day | ORAL | 2 refills | Status: AC

## 2022-07-10 NOTE — ED Provider Notes (Signed)
   [facility] Provider Note    Event Date/Time   First MD Initiated Contact with Patient 07/10/22 1024     (approximate)   History   Hypertension and Dizziness   HPI  Jeanette Leon is a 57 y.o. female with history of high blood pressure who presents with elevated blood pressure.  Patient reports she dropped off her blood pressure record at her PCPs office today and the nurse recommended that she come to the emergency department based on her blood pressures.  Patient feels well has no complaints.  No chest pain or shortness of breath.  Patient was seen in the emergency department about a week ago and started on amlodipine which she has been tolerating well     Physical Exam   Triage Vital Signs: ED Triage Vitals [07/10/22 1019]  Enc Vitals Group     BP (!) 178/102     Pulse Rate 82     Resp 16     Temp 98.2 F (36.8 C)     Temp Source Oral     SpO2 100 %     Weight 77 kg (169 lb 12.1 oz)     Height 1.676 m (5\' 6" )     Head Circumference      Peak Flow      Pain Score 0     Pain Loc      Pain Edu?      Excl. in [place]?     Most recent vital signs: Vitals:   07/10/22 1019  BP: (!) 178/102  Pulse: 82  Resp: 16  Temp: 98.2 F (36.8 C)  SpO2: 100%     General: Awake, no distress.  CV:  Good peripheral perfusion.  Resp:  Normal effort.  Abd:  No distention.  Other:     ED Results / Procedures / Treatments   Labs (all labs ordered are listed, but only abnormal results are displayed) Labs Reviewed - No data to display   EKG     RADIOLOGY     PROCEDURES:  Critical Care performed:   Procedures   MEDICATIONS ORDERED IN ED: Medications - No data to display   IMPRESSION / MDM / [place] / ED COURSE  I reviewed the triage vital signs and the nursing notes. Patient's presentation is most consistent with exacerbation of chronic illness.  Patient with known hypertension presents with elevated blood pressure,  she is asymptomatic and well-appearing.  Blood pressure here is elevated, she is compliant with her amlodipine.  She had labs done 1 week ago which were unremarkable.  Will add lisinopril, discussed side effect profile of lisinopril including angioedema, she is aware  No indication for further workup or admission at this time.        FINAL CLINICAL IMPRESSION(S) / ED DIAGNOSES   Final diagnoses:  Uncontrolled hypertension     Rx / DC Orders   ED Discharge Orders          Ordered    lisinopril (ZESTRIL) 10 MG tablet  Daily        07/10/22 1047             Note:  This document was prepared using Dragon voice recognition software and may include unintentional dictation errors.   Lavonia Drafts, MD 07/10/22 1251

## 2022-07-10 NOTE — ED Notes (Signed)
EKG was done in triage

## 2022-07-10 NOTE — ED Triage Notes (Signed)
Pt states that she was seen in the ER Friday for elevated bp systolic of 263, pt was told to keep track of her bp's and she submitted them to her dr's office this am who checked her bp and told her to come back to the ER, pt states that she is having some dizziness and feels hot, pt's sclera is noted to be red in triage, she states that they aren't usually this bad

## 2022-10-18 ENCOUNTER — Other Ambulatory Visit: Payer: Self-pay

## 2022-10-18 ENCOUNTER — Emergency Department
Admission: EM | Admit: 2022-10-18 | Discharge: 2022-10-18 | Disposition: A | Discharge status: Home / Self Care | Attending: Emergency Medicine | Admitting: Emergency Medicine

## 2022-10-18 DIAGNOSIS — M5441 Lumbago with sciatica, right side: Secondary | ICD-10-CM | POA: Diagnosis not present

## 2022-10-18 DIAGNOSIS — M545 Low back pain, unspecified: Secondary | ICD-10-CM | POA: Diagnosis present

## 2022-10-18 DIAGNOSIS — I10 Essential (primary) hypertension: Secondary | ICD-10-CM | POA: Insufficient documentation

## 2022-10-18 DIAGNOSIS — M5431 Sciatica, right side: Secondary | ICD-10-CM

## 2022-10-18 MED ORDER — PREDNISONE 10 MG PO TABS: 10.0000 mg | ORAL_TABLET | Freq: Every day | ORAL | 0 refills | Status: AC

## 2022-10-18 MED ORDER — KETOROLAC TROMETHAMINE 15 MG/ML IJ SOLN
15.0000 mg | Freq: Once | INTRAMUSCULAR | Status: AC
  Administered 2022-10-18: 15 mg via INTRAMUSCULAR
  Filled 2022-10-18: qty 1

## 2022-10-18 MED ORDER — LIDOCAINE 5 % EX PTCH
1.0000 | MEDICATED_PATCH | CUTANEOUS | Status: DC
  Administered 2022-10-18: 1 via TRANSDERMAL
  Filled 2022-10-18: qty 1

## 2022-10-18 MED ORDER — LIDOCAINE 5 % EX PTCH: 1.0000 | MEDICATED_PATCH | Freq: Two times a day (BID) | CUTANEOUS | 0 refills | Status: AC

## 2022-10-18 MED ORDER — KETOROLAC TROMETHAMINE 10 MG PO TABS: 10.0000 mg | ORAL_TABLET | Freq: Four times a day (QID) | ORAL | 0 refills | Status: AC | PRN

## 2022-10-18 MED ORDER — ACETAMINOPHEN 500 MG PO TABS
1000.0000 mg | ORAL_TABLET | Freq: Once | ORAL | Status: AC
  Administered 2022-10-18: 1000 mg via ORAL
  Filled 2022-10-18: qty 2

## 2022-10-18 MED ORDER — AMLODIPINE BESYLATE 5 MG PO TABS
10.0000 mg | ORAL_TABLET | Freq: Once | ORAL | Status: AC
  Administered 2022-10-18: 10 mg via ORAL
  Filled 2022-10-18: qty 2

## 2022-10-18 MED ORDER — CLONIDINE HCL 0.1 MG PO TABS
0.1000 mg | ORAL_TABLET | Freq: Once | ORAL | Status: AC
  Administered 2022-10-18: 0.1 mg via ORAL
  Filled 2022-10-18: qty 1

## 2022-10-18 NOTE — ED Triage Notes (Addendum)
Pt presents to ED with c/o of R leg pain, pt states HX of sciatica. Pt hypertensive in triage, pt states HX of same. Pt states she took lisinopril today at 0400 this morning.

## 2022-10-18 NOTE — ED Provider Notes (Signed)
[facility] Emergency Department Provider Note     None    (approximate)   History   Leg Pain   HPI  Jeanette Leon is a 57 y.o. female with a past medical history of hypertension who presents to the emergency department with right-sided back pain x 2 days with descending radiation to her right foot.  Pain is described as constant and sharp.  Patient has a history of sciatica and reports this is of similar symptoms.  She has tried sciatica stretches, warm and cold compresses at home with no relief.  No history of trauma or injury.  She denies nausea/vomiting, trauma, chest pain, shortness of breath, loss of bladder and bowel control.  No other complaints to report at this time.    Physical Exam   Triage Vital Signs: ED Triage Vitals  Enc Vitals Group     BP 10/18/22 1523 (!) 217/111     Pulse Rate 10/18/22 1522 81     Resp 10/18/22 1522 18     Temp 10/18/22 1522 98.7 F (37.1 C)     Temp Source 10/18/22 1522 Oral     SpO2 10/18/22 1522 95 %     Weight --      Height --      Head Circumference --      Peak Flow --      Pain Score 10/18/22 1522 10     Pain Loc --      Pain Edu? --      Excl. in [place]? --     Most recent vital signs: Vitals:   10/18/22 2100 10/18/22 2138  BP: (!) 185/97 (!) 169/89  Pulse: (!) 56   Resp:  18  Temp:    SpO2: 97% 98%   General: Alert and oriented. INAD. Discomfort.   Skin:  Warm, dry and intact. No rashes or lesions noted.     Head:  NCAT.  Eyes:  PERRLA. EOMI. Conjunctivae clear. Neck:   Supple. No cervical spine tenderness to palpation.  CV:  Good peripheral perfusion. RRR.  RESP:  Normal effort. LCTAB.   ABD:  No distention. No CVA tenderness.  BACK:  Spinous process is midline without deformity or tenderness.  MSK:   Full active range of motion limited due to pain.  Full passive range of motion without difficulty.  No warm, swollen joints. No peripheral edema.  NEURO: Cranial nerves II-XII intact.  No focal deficits. Sensation and motor function intact.  Gait steady with mild limp.    ED Results / Procedures / Treatments   Labs (all labs ordered are listed, but only abnormal results are displayed) Labs Reviewed - No data to display   PROCEDURES:  Critical Care performed: No  Procedures   MEDICATIONS ORDERED IN ED: Medications  acetaminophen (TYLENOL) tablet 1,000 mg (1,000 mg Oral Given 10/18/22 1832)  ketorolac (TORADOL) 15 MG/ML injection 15 mg (15 mg Intramuscular Given 10/18/22 1833)  cloNIDine (CATAPRES) tablet 0.1 mg (0.1 mg Oral Given 10/18/22 1939)  amLODipine (NORVASC) tablet 10 mg (10 mg Oral Given 10/18/22 2029)     IMPRESSION / MDM / ASSESSMENT AND PLAN / ED COURSE  I reviewed the triage vital signs and the nursing notes.                              Differential diagnosis includes, but is not limited to, sciatica, muscle strain, greater trochanteric bursitis  Patient's presentation is most consistent with acute, uncomplicated illness.  57 year old female presents to the emergency department for evaluation and treatment of back pain due to her sciatica.  See HPI for further details.  I will order Toradol, a lidocaine patch, and prednisone for treatment and reevaluate after observation. Vital signs are essentially stable with the exception of an elevated blood pressure in which patient takes amlodipine for maintenance.  Patient reports when sciatica worsens her blood pressure tends to elevate.   Reevaluation of patient and pain has improved.  Patient is in satisfactory and stable condition for discharge.  Patient's diagnosis is consistent with sciatica. Patient will be discharged home with prescriptions for Toradol, lidocaine patch and a short course of prednisone. Patient is to follow up with her primary care as needed or otherwise directed. Patient is given ED precautions to return to the ED for any worsening or new symptoms.    FINAL CLINICAL IMPRESSION(S) /  ED DIAGNOSES   Final diagnoses:  Sciatica of right side     Rx / DC Orders   ED Discharge Orders          Ordered    ketorolac (TORADOL) 10 MG tablet  Every 6 hours PRN        10/18/22 1904    lidocaine (LIDODERM) 5 %  Every 12 hours        10/18/22 1904    predniSONE (DELTASONE) 10 MG tablet  Daily        10/18/22 1904             Note:  This document was prepared using Dragon voice recognition software and may include unintentional dictation errors.    Romeo Apple, Nicholette Dolson A, PA-C 10/19/22 2047    Sharman Cheek, MD 10/21/22 [phone]

## 2022-10-18 NOTE — ED Notes (Addendum)
This RN went to discharge patient and patient's BP was elevated at 224/107. Provider notified. VO to give BP medication and recheck in 30-45 minutes.

## 2022-10-30 ENCOUNTER — Other Ambulatory Visit: Payer: Self-pay | Admitting: Family Medicine

## 2022-10-30 DIAGNOSIS — M5136 Other intervertebral disc degeneration, lumbar region: Secondary | ICD-10-CM

## 2022-10-30 DIAGNOSIS — M5416 Radiculopathy, lumbar region: Secondary | ICD-10-CM

## 2022-11-13 ENCOUNTER — Ambulatory Visit
Admission: RE | Admit: 2022-11-13 | Discharge: 2022-11-13 | Disposition: A | Discharge status: Home / Self Care | Source: Ambulatory Visit | Attending: Family Medicine | Admitting: Family Medicine

## 2022-11-13 DIAGNOSIS — M5136 Other intervertebral disc degeneration, lumbar region: Secondary | ICD-10-CM

## 2022-11-13 DIAGNOSIS — M5416 Radiculopathy, lumbar region: Secondary | ICD-10-CM

## 2023-01-06 ENCOUNTER — Other Ambulatory Visit: Payer: Self-pay | Admitting: Family Medicine

## 2023-01-06 DIAGNOSIS — Z1231 Encounter for screening mammogram for malignant neoplasm of breast: Secondary | ICD-10-CM

## 2023-01-07 ENCOUNTER — Ambulatory Visit
Admission: RE | Admit: 2023-01-07 | Discharge: 2023-01-07 | Disposition: A | Discharge status: Home / Self Care | Source: Ambulatory Visit | Attending: Family Medicine | Admitting: Family Medicine

## 2023-01-07 DIAGNOSIS — Z1231 Encounter for screening mammogram for malignant neoplasm of breast: Secondary | ICD-10-CM | POA: Diagnosis present

## 2024-02-25 ENCOUNTER — Other Ambulatory Visit: Payer: Self-pay | Admitting: Family Medicine

## 2024-02-25 DIAGNOSIS — Z1231 Encounter for screening mammogram for malignant neoplasm of breast: Secondary | ICD-10-CM

## 2024-03-23 ENCOUNTER — Ambulatory Visit
Admission: RE | Admit: 2024-03-23 | Discharge: 2024-03-23 | Disposition: A | Discharge status: Home / Self Care | Source: Ambulatory Visit | Attending: Family Medicine | Admitting: Family Medicine

## 2024-03-23 DIAGNOSIS — Z1231 Encounter for screening mammogram for malignant neoplasm of breast: Secondary | ICD-10-CM | POA: Insufficient documentation
